# Patient Record
Sex: Male | Born: 1935 | Race: White | Hispanic: No | Marital: Married
Health system: Southern US, Community
[De-identification: ages and names within clinical notes are randomized; demographics above are authoritative.]

---

## 2000-09-18 ENCOUNTER — Inpatient Hospital Stay (HOSPITAL_COMMUNITY): Admission: RE | Admit: 2000-09-18 | Discharge: 2000-09-20 | Payer: Self-pay | Admitting: Gastroenterology

## 2001-08-11 ENCOUNTER — Inpatient Hospital Stay (HOSPITAL_COMMUNITY): Admission: EM | Admit: 2001-08-11 | Discharge: 2001-08-18 | Payer: Self-pay | Admitting: Emergency Medicine

## 2006-09-17 ENCOUNTER — Emergency Department (HOSPITAL_COMMUNITY): Admission: EM | Admit: 2006-09-17 | Discharge: 2006-09-17 | Payer: Self-pay | Admitting: Emergency Medicine

## 2006-09-19 ENCOUNTER — Inpatient Hospital Stay (HOSPITAL_COMMUNITY): Admission: EM | Admit: 2006-09-19 | Discharge: 2006-10-13 | Payer: Self-pay | Admitting: Emergency Medicine

## 2006-09-19 ENCOUNTER — Ambulatory Visit: Payer: Self-pay | Admitting: Cardiology

## 2006-09-25 ENCOUNTER — Encounter: Payer: Self-pay | Admitting: Cardiology

## 2006-10-08 ENCOUNTER — Encounter: Payer: Self-pay | Admitting: Vascular Surgery

## 2007-02-27 ENCOUNTER — Inpatient Hospital Stay (HOSPITAL_COMMUNITY): Admission: EM | Admit: 2007-02-27 | Discharge: 2007-03-09 | Payer: Self-pay | Admitting: *Deleted

## 2007-05-07 ENCOUNTER — Emergency Department (HOSPITAL_COMMUNITY): Admission: EM | Admit: 2007-05-07 | Discharge: 2007-05-07 | Payer: Self-pay | Admitting: Emergency Medicine

## 2008-01-09 IMAGING — CR DG SHOULDER 2+V*L*
2 series · 2 of 2 positions shown · non-contrast
Comparison: 02/27/2007

CLINICAL DATA: Fell with pain. 
 LEFT SHOULDER - 2 VIEW:

[t shoulder ap external left]
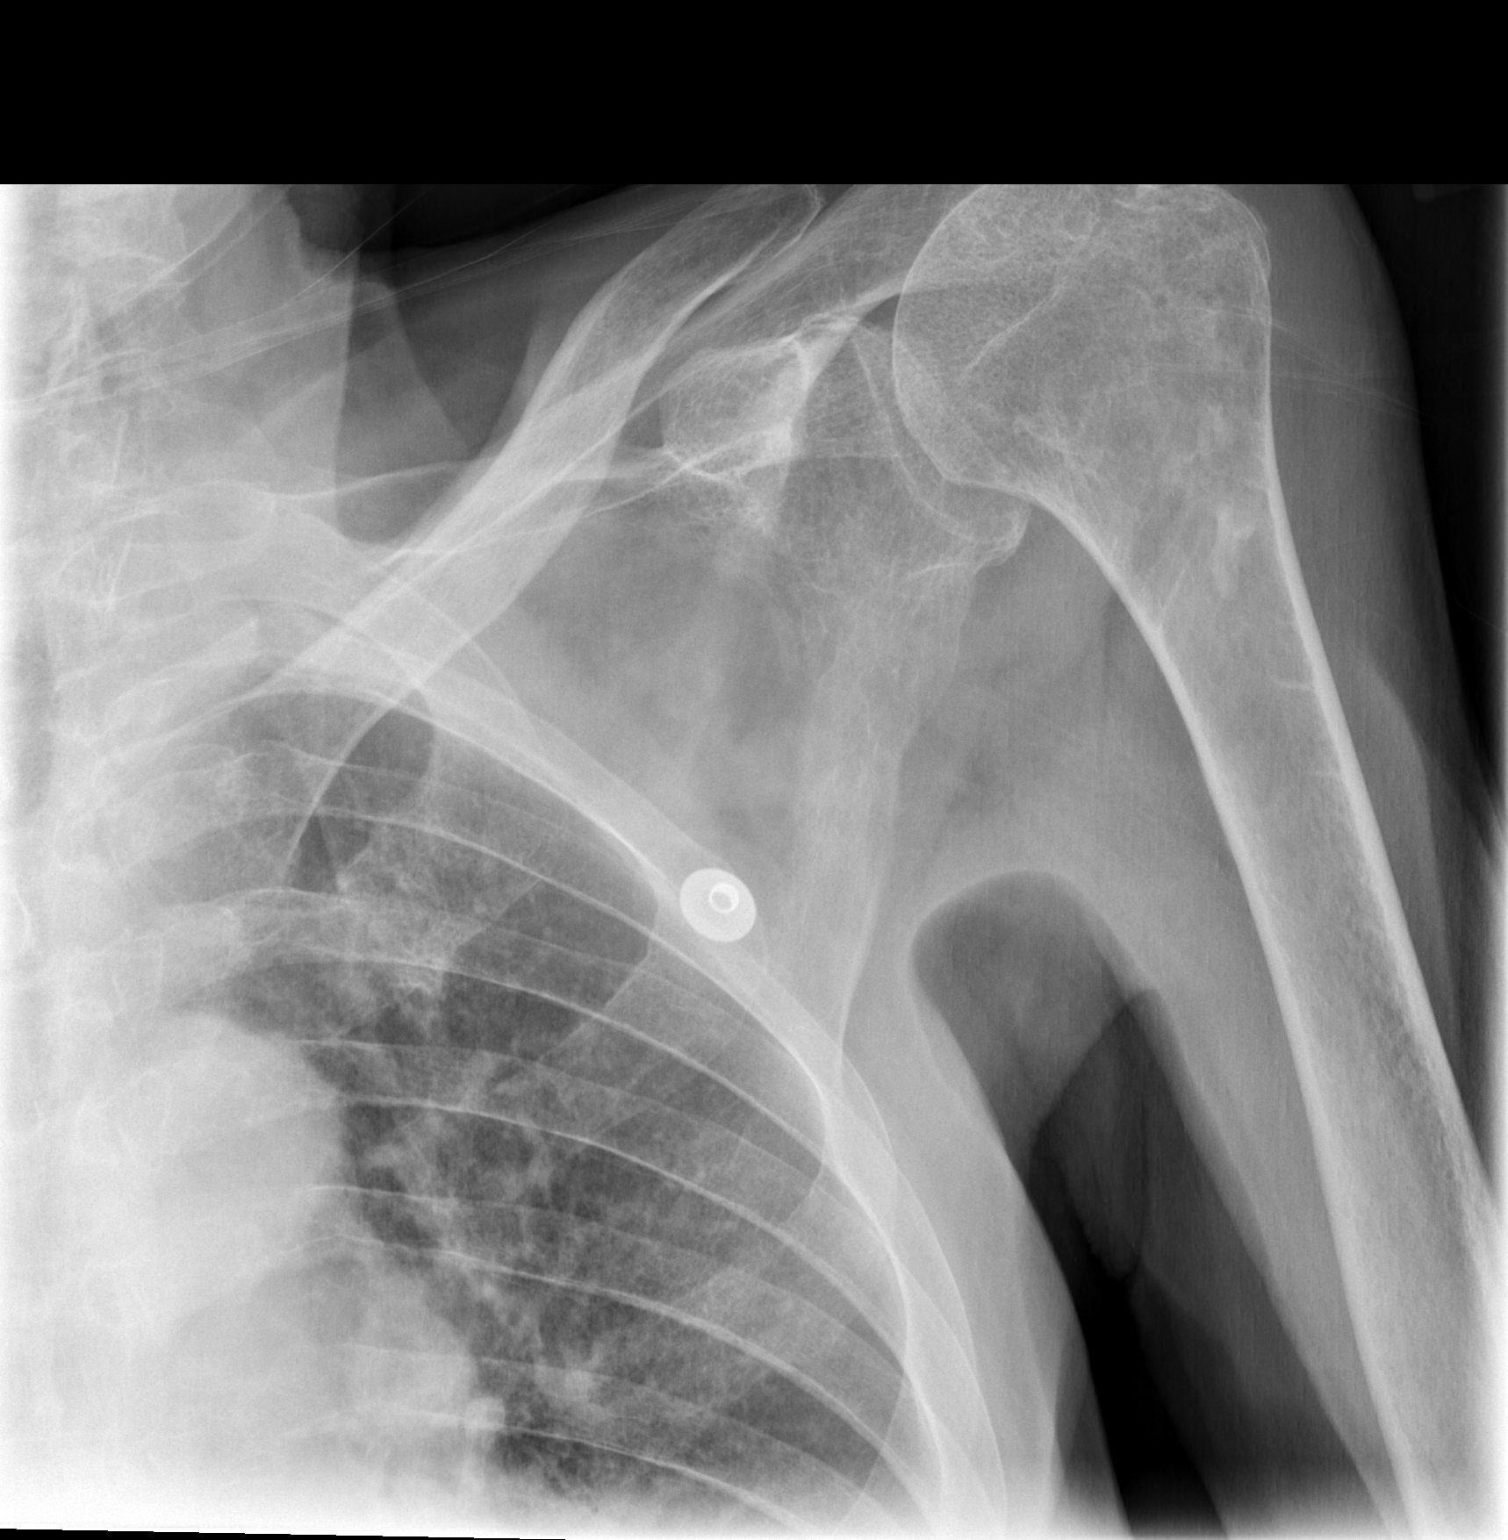

[t shoulder ap internal left]
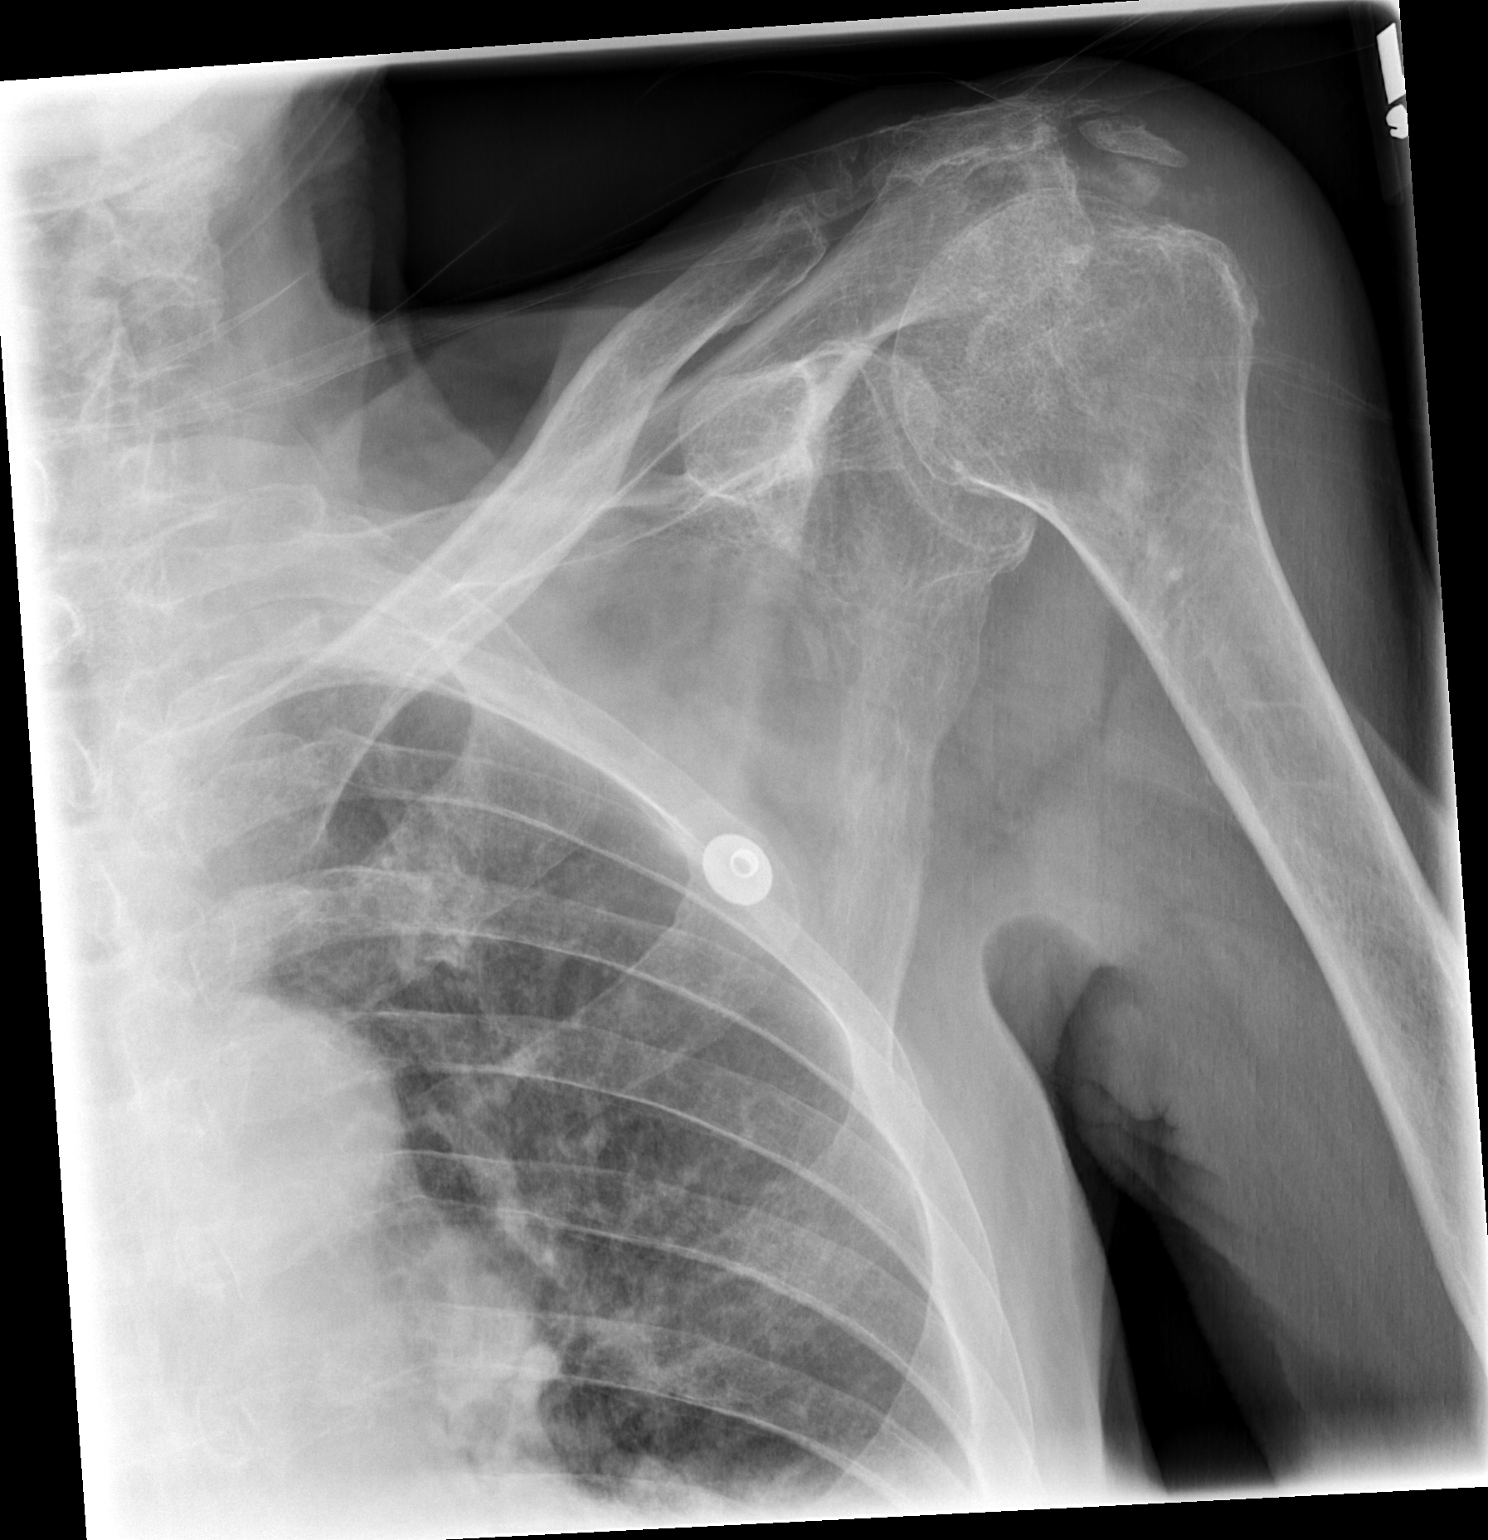

[2 of 2 positions shown; findings below may reference images not displayed]

FINDINGS: Two views of the left shoulder show the bones to be diffusely osteopenic. There are marked degenerative changes in the left shoulder and probable changes of chronic rotator cuff disease and impingement.  However, no acute fracture is seen.
IMPRESSION: Degenerative changes in left shoulder. No acute abnormality. 
 LEFT HUMERUS - 3 VIEW:
FINDINGS: Three views of the left humerus show diffuse osteopenia. No acute abnormality is seen.
IMPRESSION: No fracture.
 CHEST - 1 VIEW:
FINDINGS: Single view of the chest shows no active infiltrate or effusion. The heart is mildly enlarged and stable. The bones are diffusely osteopenic.
IMPRESSION: Mild cardiomegaly. No active lung disease.

## 2008-01-09 IMAGING — CR DG HUMERUS 2V *L*
3 series · 3 of 3 positions shown · non-contrast
Comparison: 02/27/2007

CLINICAL DATA: Fell with pain. 
 LEFT SHOULDER - 2 VIEW:

[t humerus ap left]
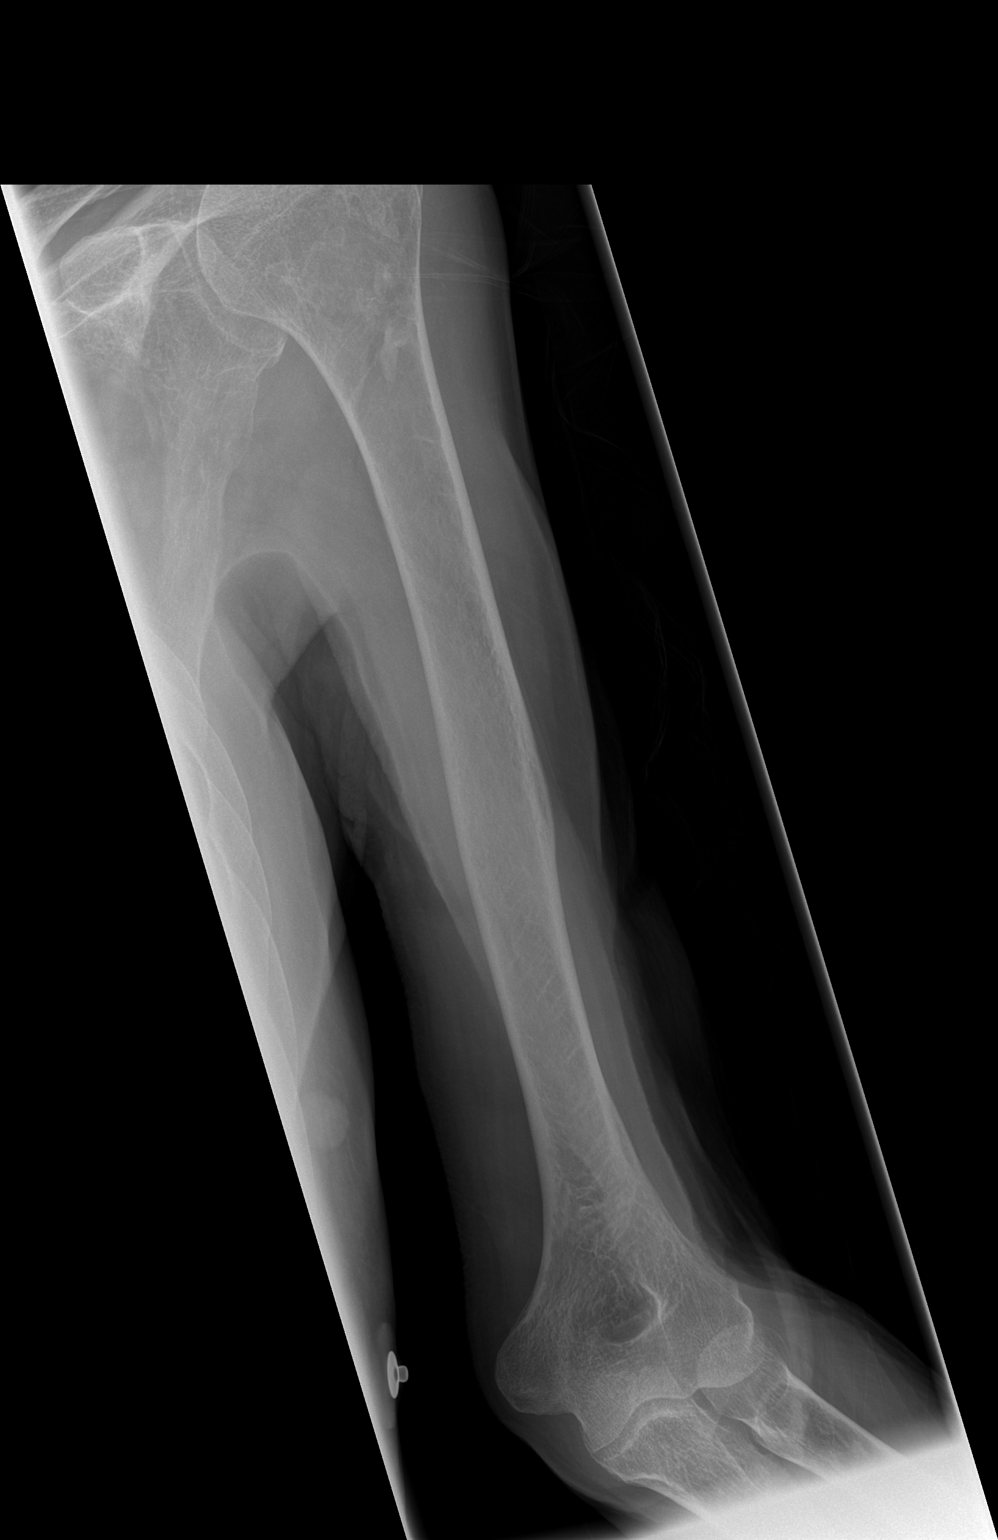

[t humerus lat left]
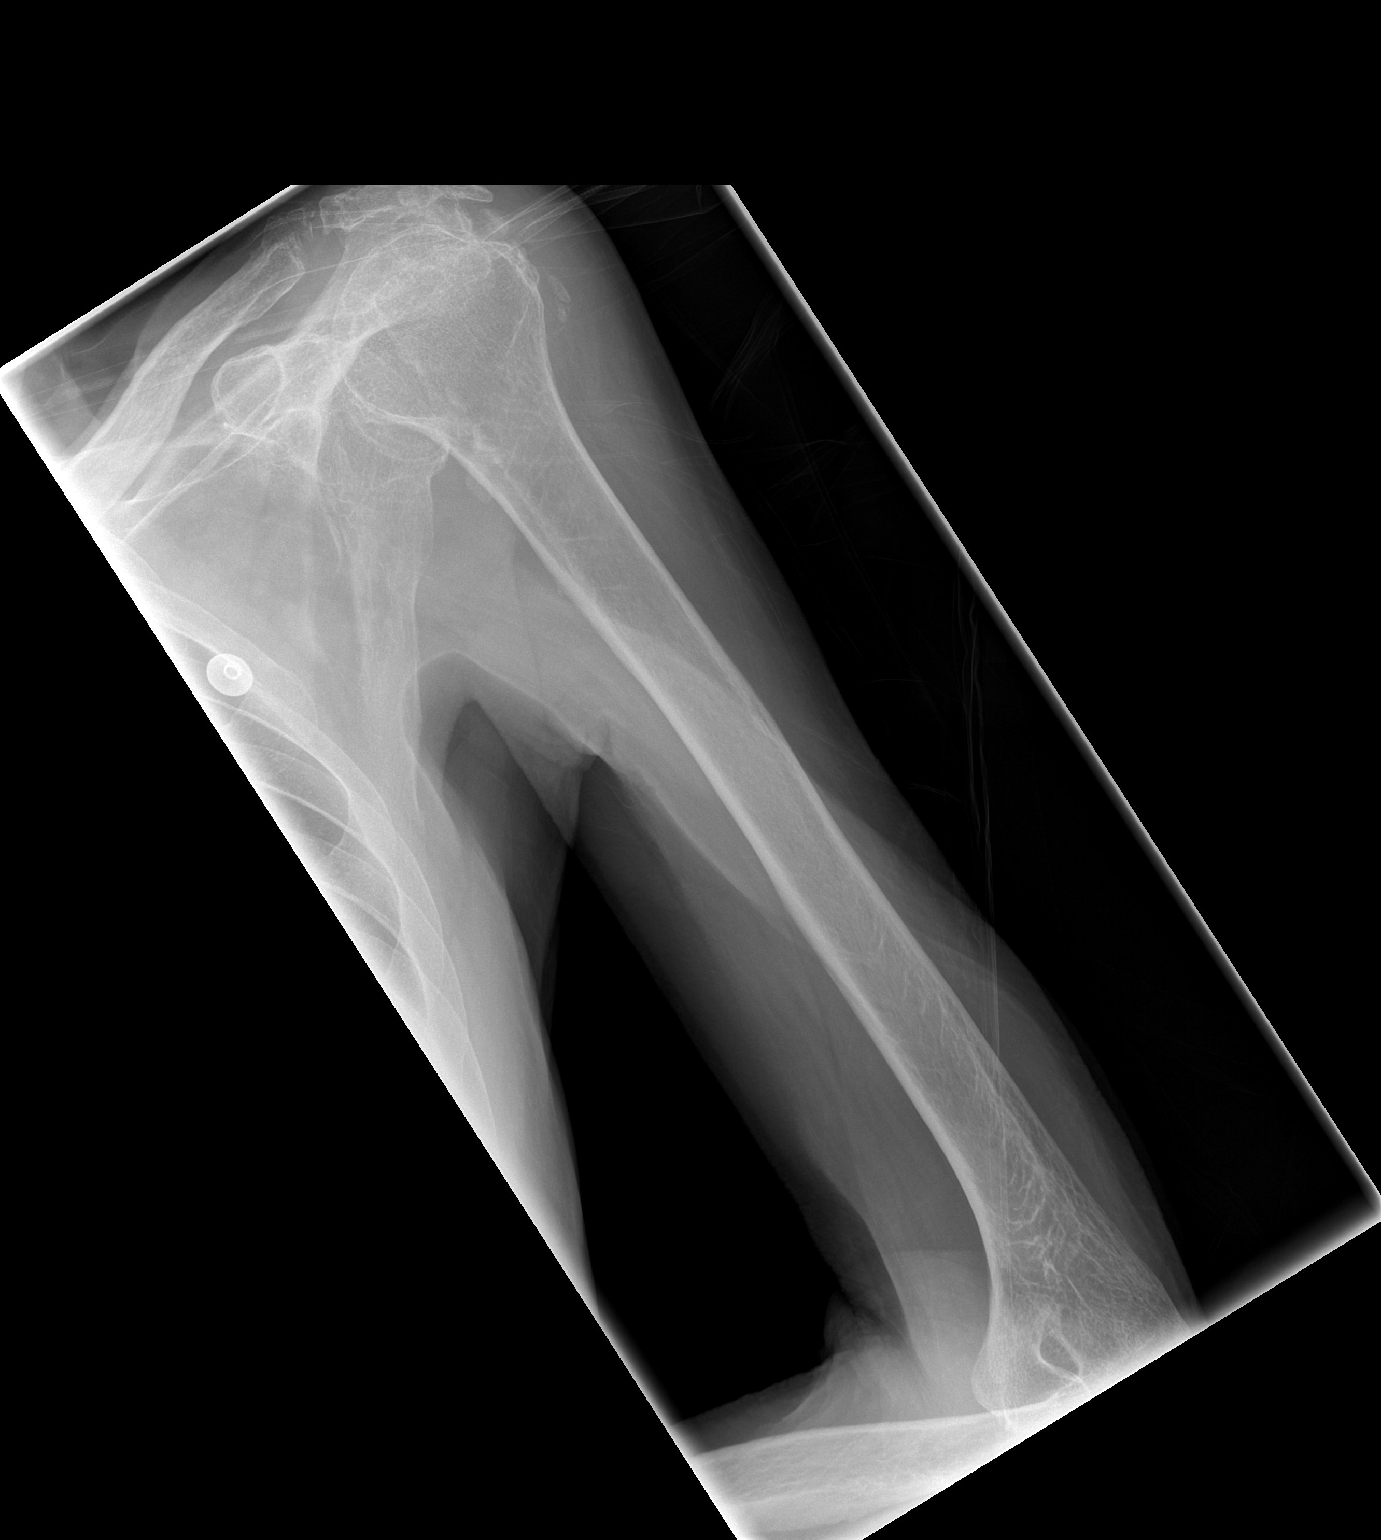

[t shoulder ap internal left]
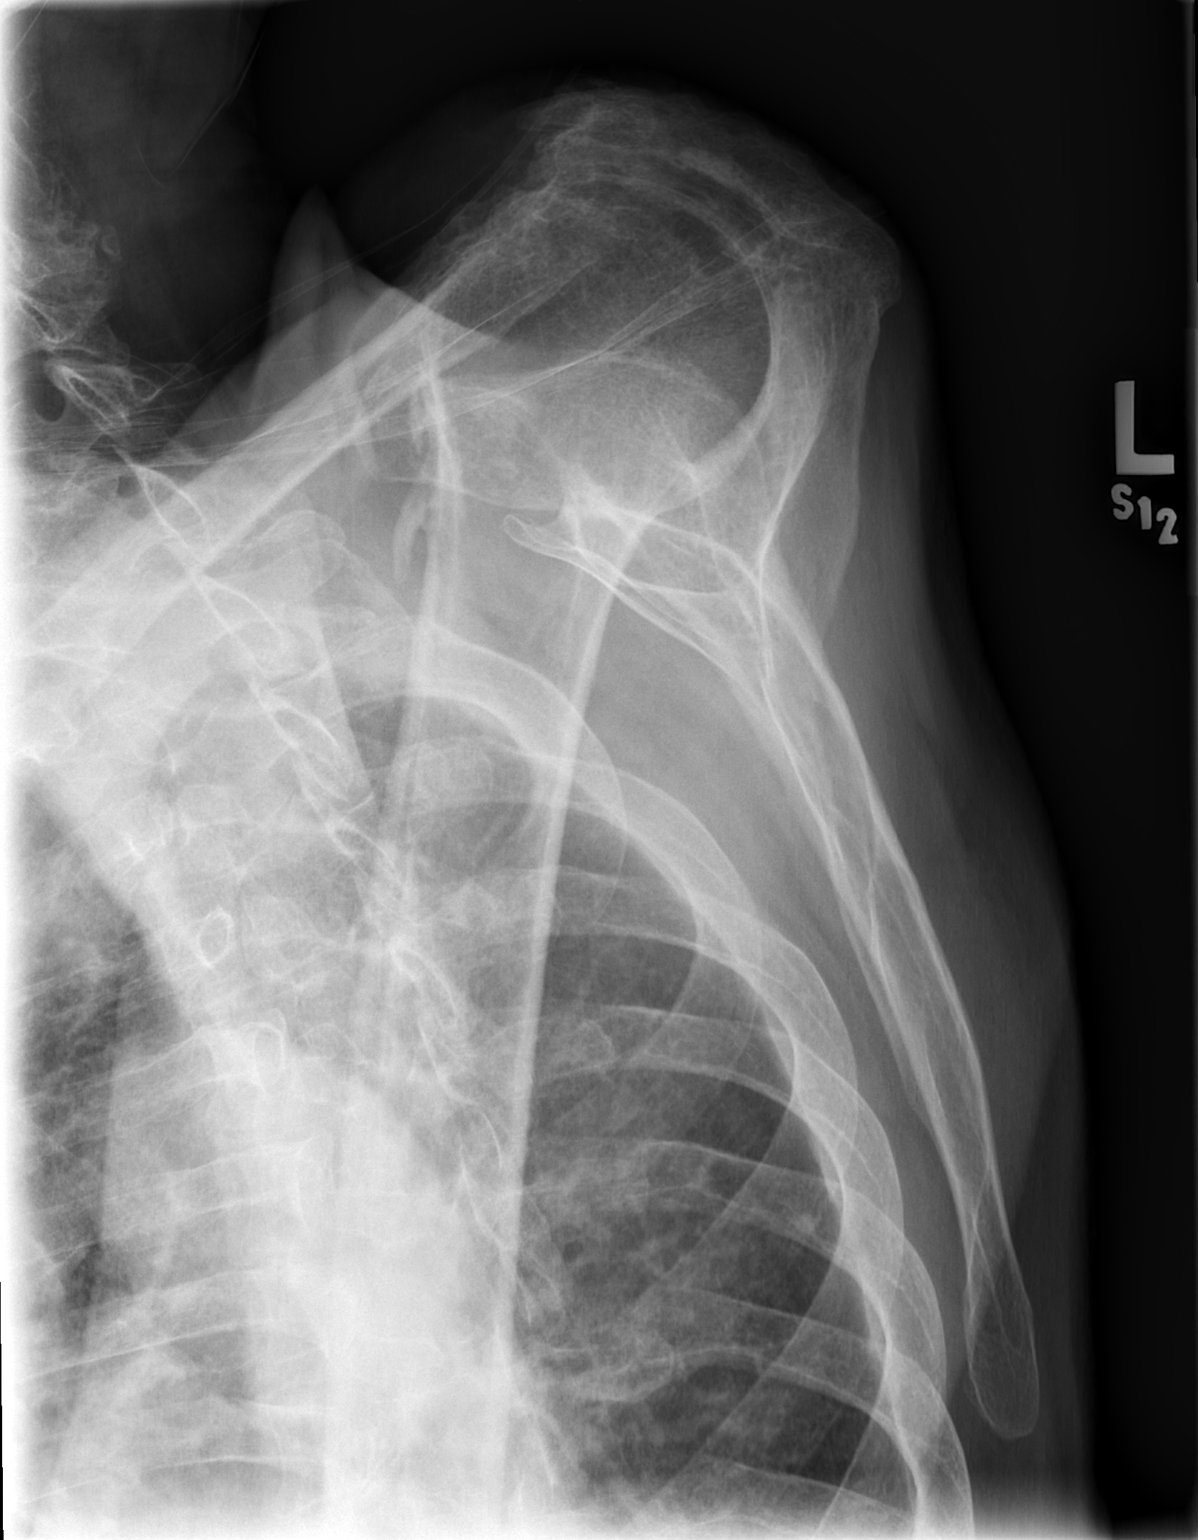

[3 of 3 positions shown; findings below may reference images not displayed]

FINDINGS: Two views of the left shoulder show the bones to be diffusely osteopenic. There are marked degenerative changes in the left shoulder and probable changes of chronic rotator cuff disease and impingement.  However, no acute fracture is seen.
IMPRESSION: Degenerative changes in left shoulder. No acute abnormality. 
 LEFT HUMERUS - 3 VIEW:
FINDINGS: Three views of the left humerus show diffuse osteopenia. No acute abnormality is seen.
IMPRESSION: No fracture.
 CHEST - 1 VIEW:
FINDINGS: Single view of the chest shows no active infiltrate or effusion. The heart is mildly enlarged and stable. The bones are diffusely osteopenic.
IMPRESSION: Mild cardiomegaly. No active lung disease.

## 2010-06-22 DEATH — deceased

## 2011-02-04 NOTE — H&P (Signed)
NAME:  Dominic Jacobs, Dominic Jacobs NO.:  [id number]   MEDICAL RECORD NO.:  [id number]           PATIENT TYPE:   LOCATION:                                 FACILITY:   PHYSICIAN:  Della Goo, M.D. DATE OF BIRTH:  1919/06/27   DATE OF ADMISSION:  02/27/2007  DATE OF DISCHARGE:                              HISTORY & PHYSICAL   PRIMARY CARE PHYSICIAN:  Gloriajean Dell. Andrey Campanile, M.D.   CHIEF COMPLAINT:  Weakness.   HISTORY OF PRESENT ILLNESS:  The history is obtained through the  patient's family and records.  Per the patient's son, the patient has  had decreased p.o. intake over the past 3 days; not eating, not  drinking; also increased weakness and fatigue.  They do not know if  these had fevers, chills, or cough.  There has been no report that he  has had diarrhea, nausea, or vomiting.  The patient is pleasantly  confused.  Denies having any chest pain and shortness of breath at this  time.  Per the patient's family, the patient has also had weakness and  difficulty walking.   PAST MEDICAL HISTORY:  1. A history of atrial fibrillation.  2. Coagulopathy secondary to Coumadin therapy.  3. COPD.  4. Hypertension.  5. Benign prostatic hypertrophy.  6. Blindness left eye.   PAST SURGICAL HISTORY:  Unknown.   MEDICATIONS:  1. Lasix 20 mg one p.o. q.a.m.  2. Potassium 20 mEq one p.o. q.a.m.  3. Lexapro 10 mg one p.o. daily.  4. Lopressor 50 mg one p.o. q.12 h.  5. Atrovent 2 puffs t.i.d.  6. Coumadin 4 mg one p.o. daily.  7. Dulcolax 5 mg one p.o. q.a.m.  8. Prilosec 20 mg one p.o. daily.  9. Oxycodone/APAP 5 mg one p.o. q.6 h. p.r.n.  10.Robitussin AC p.r.n.   ALLERGIES:  To PENICILLIN and SULFA.   SOCIAL HISTORY:  Retired from Counselling psychologist work, widower,  remote history of tobacco and alcohol usage.   FAMILY HISTORY:  Noncontributory.   PHYSICAL EXAMINATION FINDINGS:  GENERAL:  A pleasantly confused 77-year-  old thin male in no acute distress.  VITAL  SIGNS:  Temperature 100.8, 119/64.  HEENT: Normocephalic, atraumatic.  Right pupil reactive to light.  Extraocular muscles are intact.  Left cornea opaque.  Oropharynx  edentulous.  No exudates.  Dry mucosa.  NECK:  Supple full range of motion.  No thyromegaly, adenopathy, or  jugulovenous distension.  CARDIOVASCULAR:  Regular rate and rhythm.  No murmurs, gallops or rubs.  LUNGS:  Clear to auscultation except for occasional rhonchus breath  sounds.  ABDOMEN:  Positive bowel sounds, soft, nontender, nondistended.  EXTREMITIES:  Without cyanosis, clubbing or edema.  Atrophy of the  extremities present.  NEUROLOGIC EXAMINATION:  Mild confusion.  Speech is clear.  Cranial  nerves are intact except the left eye vision and pupillary response.  There are no focal deficits otherwise.   LABORATORY STUDIES:  White blood cell count 21.9, hemoglobin 13.1,  hematocrit 39.2, platelets 313, neutrophils 96% lymphocytes 2%.  Sodium  131, potassium 4.7, chloride 99, CO2 22,  BUN 21, creatinine 1.42,  glucose 123.  Urinalysis negative.  Chest x-ray findings reveal a right  upper lobe and left lower lobe airspace disease process.   ASSESSMENT:  1. An 77 year old male being admitted with bilateral pneumonia.  2. Failure to thrive syndrome.  3. Mild dehydration.  4. Mild hyponatremia.   PLAN:  The patient will be admitted for IV antibiotic treatment with  Rocephin and azithromycin.  He will be placed on IV fluids for  rehydration therapy.  Nebulizer treatments have been ordered with  Atrovent and albuterol.  Supplemental oxygen has been ordered as well.  The patient will continue on his regular medications and his  electrolytes will be adjusted as needed.  The patient will continue on  his Coumadin therapy.  A PT and PTT will be checked daily and GI  prophylaxis has been ordered.      Della Goo, M.D.  Electronically Signed     HJ/MEDQ  D:  02/27/2007  T:  02/28/2007  Job:  [id number]    cc:   Gloriajean Dell. Andrey Campanile, M.D.  Fax: [phone]

## 2011-02-04 NOTE — Discharge Summary (Signed)
NAME:  Dominic Jacobs, Dominic Jacobs                ACCOUNT NO.:  [id number]   MEDICAL RECORD NO.:  [id number]          PATIENT TYPE:  INP   LOCATION:  1435                         FACILITY[facility]  [facility]   PHYSICIAN:  Thomasenia Bottoms, MDDATE OF BIRTH:  [birth year]   DATE OF ADMISSION:  02/27/2007  DATE OF DISCHARGE:                               DISCHARGE SUMMARY   ADDENDUM:  The patient has a folate deficiency and mild B12 deficiency  and also will be going home on folate 1 mg p.o. b.i.d. and vitamin B12  shots 1,000 units IM once a month.  His Coumadin dose will be changed to  2 mg daily, alternating with 1 mg on opposite days.  The patient will be  discharged to the nursing home as soon as his bed is available.      Thomasenia Bottoms, MD  Electronically Signed     CVC/MEDQ  D:  03/09/2007  T:  03/09/2007  Job:  [id number]

## 2011-02-04 NOTE — Discharge Summary (Signed)
NAME:  Dominic Jacobs, Dominic Jacobs                ACCOUNT NO.:  [id number]   MEDICAL RECORD NO.:  [id number]          PATIENT TYPE:  INP   LOCATION:  1435                         FACILITY:  [facility]   PHYSICIAN:  Thomasenia Bottoms, MDDATE OF BIRTH:  1919-01-17   DATE OF ADMISSION:  02/27/2007  DATE OF DISCHARGE:                               DISCHARGE SUMMARY   Please see discharge summary dictated on March 03, 2007.   BRIEF SUMMARY OF HOSPITAL COURSE:  The patient has done well since the  previous discharge summary was dictated.  He essentially has been  waiting for a nursing home bed, and we believe that will be available  very shortly.   MEDICATIONS AT THE TIME OF DISCHARGE:  1. Coumadin 2 mg p.o. daily.  2. Atrovent 2 puffs t.i.d.  3. Lopressor 50 mg p.o. q12h.  4. Lexapro 10 mg p.o. daily.  5. Lasix 20 mEq p.o. daily.  6. Potassium 20 mEq p.o. daily.  7. Guaifenesin 600 mg p.o. b.i.d. p.r.n.  8. Senokot S 1 tablet p.o. every bedtime, hold for diarrhea.  9. Xopenex nebulizer t.i.d. p.r.n.  10.Atrovent nebulizer t.i.d. p.r.n.  11.Protonix 40 mg p.o. daily.  12.Ensure t.i.d.  13.Lopressor 50 mg p.o. q12h.   Please refer to the H&P for admission details and the previous discharge  summary dictated on March 03, 2007.      Thomasenia Bottoms, MD  Electronically Signed     CVC/MEDQ  D:  03/08/2007  T:  03/08/2007  Job:  [id number]   cc:   Gloriajean Dell. Andrey Campanile, M.D.  Fax: [phone]

## 2011-02-04 NOTE — Discharge Summary (Signed)
NAME:  Dominic Jacobs, Dominic Jacobs                ACCOUNT NO.:  [id number]   MEDICAL RECORD NO.:  [id number]          PATIENT TYPE:  INP   LOCATION:  1435                         FACILITY:  [facility]   PHYSICIAN:  Mobolaji B. Bakare, M.D.DATE OF BIRTH:  [birth year]   DATE OF ADMISSION:  02/27/2007  DATE OF DISCHARGE:                               DISCHARGE SUMMARY   PRIMARY CARE PHYSICIAN:  Dr. Benedetto Goad   CARDIOLOGIST:  Dr. Peter Swaziland   FINAL DIAGNOSES:  1. Bilateral lower lobe pneumonia.  2. Severe pharyngeal dysphagia.  3. Moderate oral dysphagia.  4. Coagulopathy secondary to Coumadin with INR is 8.9 without any      bleeding.  5. Failure to thrive.   SECONDARY DIAGNOSES:  1. History of atrial fibrillation.  2. History of multifocal atrial tachycardia.  3. Chronic obstructive pulmonary disease.   PROCEDURE:  1. Chest x-ray done on June 7 showed bilateral airspace disease which      represents bilateral infectious infiltrates, less likely edema.  2. Modified barium swallow which showed severe pharyngeal and moderate      oral dysphagia with silent aspiration of thin liquids.   BRIEF HISTORY:  Mr. Esau is a pleasant 77 year old Caucasian male who  resides at home independently.  His son lives next door and his daughter  about 200 yards away.  He presented to the emergency room with weakness,  decreased appetite, cough.  There were no other symptoms.  He was  pleasantly confused.  Family denies history of dementia.  There was no  chest pain.  Initial vital signs revealed temperature of 100.8.  Lungs  revealed bilateral rhonchi.  Initial laboratory data showed white cell  of 22,000 with a left shift.  Chest x-ray is as noted above.  He was  admitted for bilateral pneumonia, failure to thrive.  He was mildly  dehydrated also.   HOSPITAL COURSE:  #1 - BILATERAL PNEUMONIA.  The patient was started on  IV antibiotics with Rocephin and Zithromax.  He was evidently coughing.  Concern was  possible aspiration.  He was seen by speech therapist and  swallowing evaluation was done.  The patient clearly had severe  pharyngeal and moderate oral dysphagia with silent aspiration of thin  liquids just below the vocal folds.  The patient is felt to be at risk  with any p.o.  However, he appeared to tolerate thinner consistency  better - he had reduced residuals.  Pureed diet D-3 with thin was  recommended; alternatively, n.p.o. and pursuing other means of feeding.  Currently the patient has been tried on D-3/thin liquid.  If he does not  tolerate these, we will further discuss with family other options  available, i.e. PEG tube and n.p.o.  He will complete a 1-week course of  antibiotics.  IV antibiotics have been transitioned to p.o. Avelox.   The patient had mildly elevated troponin of 0.10 maximum.  He had no  acute EKG changes.  EKG showed atrial fibrillation with a heart rate of  95.  CK-MB and relative index were normal.  The patient had no chest  pain.  He had a 2-D echocardiogram in January 2008 which showed an  estimated ejection fraction to be 60%.  We did not pursue any further  workup, given the patient's age and comorbidities.  There was no chest  pain or elevated CK-MB.  Isolated elevated troponin was in the setting  of ongoing respiratory infection.   #2 - COAGULOPATHY SECONDARY TO COUMADIN.  The patient was admitted with  elevated INR above 8.  He had no bleeding.  This was monitored for 3  days without significant reduction.  He was given 2.5 mg of vitamin K  orally.  INR at time of dictation was 3.2.  Coumadin will be recommenced  when appropriate prior to discharge.   #3 - FAILURE TO THRIVE.  The patient's poor p.o. intake is secondary to  the ongoing infection.  His appetite is picking up at the time of  dictation.  He, however, seemed unsafe to go back home without 24-hour  assistance.  The physical therapist has recommended skilled nursing  facility  placement.  This is being pursued at this time.   #4 - HYPERTENSION.  The patient was continued on his home dose  medications.  Blood pressure was relatively stable during the course of  hospitalization.   #5 - CHRONIC OBSTRUCTIVE PULMONARY DISEASE.  He was on nebulizations and  this was relatively stable.   #6 - DIET.  Heart-healthy diet, dysphagia 3/chopped with thin liquid,  aspiration precautions, with the patient being fed when awake, tuck  chin. Follow solid with liquid.  Given p.o. medications in puree, allow  to cough with sips of thin liquids.  Reflux precautions.  Give oral care  t.i.d.   Addendum to this dictation will be made at the time of discharge with  discharge medications and laboratory data.      Mobolaji B. Corky Downs, M.D.  Electronically Signed     MBB/MEDQ  D:  03/03/2007  T:  03/03/2007  Job:  [id number]   cc:   Gloriajean Dell. Andrey Campanile, M.D.  Fax: [phone]   Peter M. Swaziland, M.D.  Fax: [phone]

## 2011-02-07 NOTE — Discharge Summary (Signed)
NAME:  Dominic Jacobs, Dominic Jacobs                ACCOUNT NO.:  [id number]   MEDICAL RECORD NO.:  [id number]          PATIENT TYPE:  INP   LOCATION:  1443                         [facility]ACILITY:  [facility]   PHYSICIAN:  Lonia Blood, M.D.      DATE OF BIRTH:  1919-06-04   DATE OF ADMISSION:  09/19/2006  DATE OF DISCHARGE:  09/29/2006                               DISCHARGE SUMMARY   PRIMARY CARE PHYSICIAN:  Dr. Benedetto Goad, [facility].   DISCHARGE DIAGNOSES:  1. Altered mental status, most likely secondary to dehydration and      acute renal failure, now resolved.  2. New onset atrial fibrillation with normal ejection fraction.  3. Chronic obstructive pulmonary disease.  4. Acute renal failure, now resolved.  5. Dehydration.  6. Generalized debilitation.   SULFA ALLERGY, AND PENICILLIN ALLERGY.   Transient hypokalemia.   DISCHARGE MEDICATIONS:  1. Coumadin, currently on 2 mg a day.  INR to be followed closely.  2. Lopressor 25 mg q.12 hours b.i.d. diuresis.  3. Avelox 400 mg daily for 3 more days.  4. Cardizem CD 360 mg daily.  5. Prevacid 30 mg daily.  6. Albuterol nebulizer 2.5 mg 3 times a day.  7. Atrovent nebulizers 0.5 mg 3 times a day.  8. Dulcolax 10 mg twice a day.  9. Robitussin DM as needed.  10.Hydrochlorothiazide 25 mg daily.   DISPOSITION:  Patient is being discharged to short-term skilled nursing  facility.  He has improved tremendously in the hospital.  He is,  however, weak and needs extensive physical therapy to get back to his  baseline prior to being discharged home.   PROCEDURES PERFORMED THIS ADMISSION:  Head CT without contrast performed  on December 29 showed small rounded hypodense extra-axial focus in the  left frontal region, which could reflect a small focus of extra-axial  hemorrhage or partially calcified mass.  No other acute intracranial  findings demonstrated.  Chest x-ray on December 29 showed progressive  right basilar atelectasis with stable  cardiomegaly.  X-ray of the eye  showed no metallic foreign bodies in the orbit on January 2.  MRI/MRA of  the brain on September 23, 2006 showed a 5-mm left frontal extra-axial  structure which may represent a small meningioma.  No acute infarct or  abnormal intracranial enhancing lesion.  Atrophy with significant small-  vessel disease type changes.  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  Mild mucosal  thickening ethmoid sinus air cells.  An abdominal x-ray on January 3  showed no obstruction or free air.  2D echocardiogram on January 4  showed an EF of 60%, with normal left ventricular function and no  significant findings.  Chest x-ray on January 7 showed new left pleural  effusion which is small, associated with some atelectasis plus or minus  infiltrate in the posterior left lower lobe.   CONSULTATION:  Cardiology, Dr. Swaziland.   BRIEF HISTORY AND PHYSICAL:  Please refer to dictated history and  physical by Dr. Isidor Holts.  In short,  however, this is an 77-year-  old gentleman with multiple medical problems including BPH status post  TURP.  Patient came in secondary to having acute confusion at home.  He  has also had what appears to be acute bronchitis, and was given some  Zithromax by his primary physician.  He took more Zithromax than  prescribed, and he started having nausea and vomiting.  Since that  vomiting, he has become increasingly confused, and was brought in to the  emergency room.  In the ED, patient was found to have an acute renal  failure, be dehydrated, and questionable sepsis.  He was also a little  bit confused, and was subsequently admitted for further management.   HOSPITAL COURSE:  1. Confusion.  This was thought to be secondary to acute renal failure      and severe dehydration.  Patient was hydrated.  His renal function      improved tremendously.  On admission, his creatinine was 2.4, but       has since trended downwards.  With this finding, patient was      hydrated mainly and workup included checking for FENA.  That was      less than 1, indicating that this is prerenal.  His last creatinine      was 1.08.  His mental status has come back to within normal.  2. Atrial fibrillation.  While in the hospital, patient was noted to      have unusual heart rhythm.  EKG performed showed that patient was      in atrial fibrillation.  He had no prior history of atrial      fibrillation.  With this finding, he was transferred to the ICU,      and started on IV Cardizem.  Anticoagulation was, however, delayed      due to patient's head CT showing possible foreign body behind the      left eye.  Subsequently, an MRI was performed that showed no      foreign body, with disks clear.  Patient was initiated on      anticoagulation, and is currently on Coumadin.  His INR has been      therapeutic since then, and his rate has been controlled.  He is on      oral Cardizem at this point.  3. Hypokalemia.  Patient's potassium has dropped to 3 at some point,      and also 2.9 at another time.  This has been resolved so far.  4. COPD.  Patient has had some ongoing cough and possible pneumonia      based on his x-ray.  He has been on antibiotics.  He has been on      Avelox.  He has remained afebrile at this point.   Other medical problems as listed above have been stable, and patient is  debilitated, hence request skilled nursing facility placement prior to  returning home.      Lonia Blood, M.D.  Electronically Signed     LG/MEDQ  D:  09/29/2006  T:  09/29/2006  Job:  [id number]
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
## 2011-02-07 NOTE — Discharge Summary (Signed)
NAME:  Dominic Jacobs, Dominic Jacobs NO.:  [id number]   MEDICAL RECORD NO.:  [id number]          PATIENT TYPE:  INP   LOCATION:  1443                         FACILITY:  [facility]   PHYSICIAN:  Michaelyn Barter, M.D. DATE OF BIRTH:  01-06-1919   DATE OF ADMISSION:  09/19/2006  DATE OF DISCHARGE:  10/13/2006                               DISCHARGE SUMMARY   This is a final discharge summary.  Please refer to the discharge  summaries that were completed by Dr. Marcellus Scott on October 06, 2006  and also by Dr. Lonia Blood on September 29, 2006.  This discharge summary  will only outline those events that have taken place from the timeframe  of October 07, 2006 through October 13, 2006.   FINAL DIAGNOSES:  1. Small bowel obstruction.  On October 06, 2006, the patient      underwent an open repair of bilateral inguinal hernias with mesh.      The procedure was completed by Dr. Glenna Fellows.  The patient      appears to have tolerated the procedure well.  There have been no      obvious complications over the past 7 days.  2. Arrhythmia.  Cardiology has been following the patient over the      past several days.  Their last note was documented October 12, 2006, and they indicated that the patient's rhythm has been stable      since his surgery and since the patient has been on Lopressor.  The      patient had previously been on IV Cardizem drip, but that was      subsequently discontinued, and he was switched over to p.o.      medications.  Over the past 1-2 days, there have been no issues      with regard to him having arrhythmias.  3. History of chronic obstructive pulmonary disease.  This has been      stable over the course of his hospitalization.  4. Hypoalbuminemia.  This may be associated with protein calorie      malnutrition.  The patient has been encouraged to consume regularly      prescribed meals.  This will have to continue once the patient is      discharged  from the hospital.  5. Anemia.  This has been relatively stable over the past few days of      the patient's hospitalization.  6. Severe weakness, deconditioning.  Physical therapy and occupational      therapy have both been following the patient over the course of his      hospitalization.  The patient will require more aggressive physical      therapy once he is discharged from the hospital.  7. Hypokalemia.  The patient's potassium level was noted to have      declined down to 3.2 by October 12, 2006.  He has since received      supplementation.  8. Atrial fibrillation.  Again, the patient has been on      anticoagulation secondary  to this.  He is currently taking Coumadin      for this.  There have been no major issues over the past several      days regarding this, and again cardiology has been following the      patient throughout the past several days of his hospitalization.   CONDITION ON THE DATE OF DISCHARGE:  On the date of discharge, the  patient indicated that he has no new complaints.  He shows no obvious  distress, and he actually looks comfortable.   LABORATORIES:  His white blood cell count is 6.8, hemoglobin 9.8,  hematocrit 28.4, platelets 204.  PT is 31.1, INR 2.8.  Sodium 140,  potassium 3.1, chloride 107, CO2 28, BUN 8, creatinine 1.14.   A decision has been made to discharge the patient from the hospital.   He will be discharged from the hospital on:  1. Dulcolax 5 mg p.o. b.i.d.  2. Lasix 20 mg p.o. daily.  3. Atrovent MDI 2 puffs t.i.d.  4. Lopressor 50 mg p.o. q.12 h.  5. Avelox 400 mg p.o. daily.  6. Protonix 40 mg p.o. daily.  7. K-Dur 20 mEq p.o. daily.  8. Coumadin 1 mg p.o. daily.  9. Robitussin 10 mL p.o. q.6 h. p.r.n.      Michaelyn Barter, M.D.  Electronically Signed     OR/MEDQ  D:  10/13/2006  T:  10/13/2006  Job:  [id number]   cc:   Gloriajean Dell. Andrey Campanile, M.D.  Fax: [phone]

## 2011-02-07 NOTE — Discharge Summary (Signed)
NAME:  Dominic Jacobs, Dominic Jacobs NO.:  [id number]   MEDICAL RECORD NO.:  [id number]          PATIENT TYPE:  INP   LOCATION:  1443                         FACILITY:  [facility]   PHYSICIAN:  Marcellus Scott, MD     DATE OF BIRTH:  Dec 16, 1918   DATE OF ADMISSION:  09/19/2006  DATE OF DISCHARGE:                               DISCHARGE SUMMARY   PRIMARY CARE PHYSICIAN:  Dr. Benedetto Goad of [facility].   This is an addendum discharge summary which will only outline the events  of this patient's inpatient care from January 9th to date.  For details  of his inpatient care prior to that, please refer to the history and  physical note done by Dr. Brien Few on Dec 29th and the discharge summary that  was done by Dr. Ellin Saba on January 8th.   DISCHARGE DIAGNOSES:  1. Small bowel obstruction.  2. Arrhythmias.  3. Chronic obstructive pulmonary disease.  4. Hypertension.  5. Leukocytosis.  6. Prerenal azotemia.   DISCHARGE MEDICATIONS:  To be determined at the time of actual  discharge.   PROCEDURES:  Multiple x-rays of the chest and abdomen since January 8th  to follow up on the small bowel obstruction.   CONSULTS:  1. Surgery, Dr. Johna Sheriff.  2. Cardiology, Dr. Peter Swaziland.   HOSPITAL COURSE/CONDITION OF PATIENT:  Patient was scheduled to be  discharged on the 8th Jan, when overnight he was noted to have abdominal  distention with copious amounts of vomiting, following which discharge  was held, an NG tube was passed, and patient had copious amounts of  bilious drainage.  X-rays subsequently revealed small bowel obstruction.  Surgery was consulted, and they thought that the small bowel obstruction  was secondary to his incarcerated inguinal hernia, which was reducible.  Patient was made nil per oral, placed on intravenous fluids, and serial  abdominal x-rays were followed.  With these measures, the patient  gradually improved with decrease in the abdominal distention  and  decrease in the small bowel obstruction.  The NG tube was subsequently  taken out, and patient was started on some feeds, pending bilateral  herniorrhaphy.  During this course, the patient was having multiple  arrhythmias, including multifocal atrial tachycardias, PVCs,  nonsustained ventricular tachycardic events on the monitor, although he  was in sinus rhythm.  The patient was re-evaluated and followed by  cardiology, who proceeded to start him on beta blockers.  The patient  was also started on a heparin drip for his history of paroxysmal atrial  fibrillation.  Patient was cleared from a cardiac standpoint for  bilateral hernioraphy.  Patient was asymptomatic of any dyspnea, chest  pain.  Had some cough.  He was deemed at some risk for the surgery,  considering his advanced age, his overall debilitated state, and his  history of COPD.  The patient had surgery today.  He is to be followed  over the next couple of days. I have had multiple discussions with the  patient's daughter, Santina Evans, as well as the patient's son, to update  them on the patient's  hospital course.  They would like the patient to  be a full code.  Patient's blood pressures have been in the 140s to 150s  range on the antihypertensives.  The patient also has had persistent  leukocytosis, which briefly came down,  seems to have the etiology which  is unclear.  He has been on IV Avelox.  His chest x-ray is suggestive of  bilateral pleural effusions, and a questionable pneumonia; however,  there is no worsening of lung findings clinically.  There is no fever.  There are no symptoms apart from a dry cough. The leucocytosis has to be  followed. Patient also had a prerenal azotemia which resolved promptly  with IV hydration.  Patient also has been anemic, but his hemoglobins  and hematocrits have been stable.      Marcellus Scott, MD  Electronically Signed     AH/MEDQ  D:  10/06/2006  T:  10/07/2006  Job:   [id number]   cc:   Gloriajean Dell. Andrey Campanile, M.D.  Fax: [phone]   Peter M. Swaziland, M.D.  Fax: [phone]   Lorne Skeens. Hoxworth, M.D.  [address]  [place]  [place] [address]

## 2011-02-07 NOTE — Procedures (Signed)
[place]  Patient:    Dominic Jacobs, Dominic Jacobs Visit Number: [id number] MRN: [id number]          Service Type: MED Location: [phone] Attending Physician:  Anastasio Auerbach Proc. Date: 08/15/01 Admit Date:  08/11/2001   CC:         Anastasio Auerbach, M.D.  Roosvelt Harps, M.D.   Procedure Report  PROCEDURE PERFORMED:  Esophagogastroduodenoscopy with biopsy.  ENDOSCOPIST:  Petra Kuba, M.D.  INDICATION:  Patient with recurrent GI bleeding.  CONSENT:  Consent was signed after risks had been discussed with multiple family members multiple times in the past.  MEDICATIONS:  Demerol 30 mg and Versed 3 mg.  DESCRIPTION OF PROCEDURE:  The video endoscope was inserted by direct vision. The esophagus was normal.  In the distal esophagus was a moderate size hiatal hernia.  The scope was passed into the stomach.  No blood was seen or advanced to the antrum.  There was a widely patent pylorus.  There was a linear antral ulcer with edema, but no signs of bleeding or at risk lesions.  The scope passed easily into the bulb where another small bleeding ulcer was seen, but again no heme.  The scope was inserted around the cecum to a normal second portion of the duodenum.  No blood was seen distally.  The scope was withdrawn back to both the bulb and the antrum.  Both areas were washed and watched, but no active bleeding or other lesions were seen.  The scope was retroflexed, which revealed a normal fundus angularis.  The cardiac was pertinent for the moderate size hiatal hernia.  The lesser and greater curve were normal on retroflexion and straight visualization.  We went ahead and reevaluated the antrum, pylorus, and bulb one more time without additional findings.  One biopsy of the antrum for the CLObiopsy as well as Helicobacter was obtained. Air was suctioned.  The scope removed again.  A good look at the esophagus on slow withdrawal was normal.  The scope was  removed.  The patient tolerated the procedure well.  There were no obvious or immediate complications.  ENDOSCOPIC DIAGNOSES: 1. Moderate hiatal hernia. 2. Shallow linear antral ulcer and shallow bulb ulcer without any    at risk lesions or heme. 3. Otherwise normal esophagogastroduodenoscopy to the second    portion of the duodenum without any blood being seen status    post CLObiopsy of the antrum to rule out Helicobacter.  PLAN: 1. Slowly advance diet. 2. Transfuse p.r.n. 3. Consider outpatient colonoscopy just to be sure, although this    rebleeding was compatible with an upper bleed with    increasing in BUN and melena. 4. Reduce pump inhibitors long-term and not use any aspirin or    nonsteroidals secondary to this being a second bleed and would    go ahead and treat Helicobacter pylori if the CLOtest is    positive. Attending Physician:  Anastasio Auerbach DD:  08/15/01 TD:  08/16/01 Job: [id number]
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
## 2011-02-07 NOTE — Op Note (Signed)
NAME:  JERMAIN, CURT                ACCOUNT NO.:  [id number]   MEDICAL RECORD NO.:  [id number]          PATIENT TYPE:  LINP   LOCATION:  1443                         FACILITY:  [facility]   PHYSICIAN:  Sharlet Salina T. Hoxworth, M.D.DATE OF BIRTH:  [birth year]   DATE OF PROCEDURE:  DATE OF DISCHARGE:                               OPERATIVE REPORT   PRE-AND-POSTOPERATIVE DIAGNOSIS:  Bilateral inguinal hernias.   SURGICAL PROCEDURE:  Open repair of bilateral inguinal hernias with  mesh.   SURGEON:  Lorne Skeens. Hoxworth, M.D.   ANESTHESIA:  General.   BRIEF HISTORY:  Mr. Hylton is 77 year old with multiple medical problems.  While in the hospital he developed evidence of small bowel obstruction;  and on my examination, several days ago, was found to have an  incarcerated right inguinal hernia which was mainly reduced with  resolution of his bowel obstruction.  Also noted was a tender left  inguinal hernia.  With this history, we have recommended proceeding with  bilateral inguinal hernia repair under general anesthesia.  The nature  of he procedure; indications risks of bleeding, infection,  cardiorespiratory complications were discussed with the family, as well  as, the patient preoperatively.  He was brought to the operating room  for this procedure.   DESCRIPTION OF OPERATION:  The patient was brought to the operating room  and placed in the supine position on the operating table and general  endotracheal anesthesia was induced.  He received preoperative IV  antibiotics.  Both groins were widely sterilely prepped and draped.  The  right side was approached first through an oblique incision in the  groin.  Dissection was carried down through subcutaneous tissues using  cautery.  The external oblique was identified and divided along the line  of its fibers through the external ring.  The cord was dissected off the  floor the pubic tubercle and cremasteric fibers were divided bilaterally  completely freeing the cord up to the internal ring.  There was a good-  sized indirect sac; and a lot of edema and inflammatory change around  the sac; and the cord structures consistent with a recent significant  episode of incarceration.  The sac was dissected free from the cord  structures using cautery and blunt dissection and completely freed up to  the level of the internal ring.  The sac was opened; and there was a  portion of the bowel in the sac, which was reduced; and the sac was  suture-ligated at the internal ring with 2-0 silk and the sac removed.  A piece of Parietex mesh was used, trimmed to size, to fit the floor  with tails going around the cord to the internal ring.  The mesh was  sutured to the pubic tubercle and then to the inguinal ligament working  medial-to-lateral with running 2-0 Prolene.  Medially, the mesh was  sutured to the edge of the rectus sheath with interrupted 2-0 Prolene.  Tails were then tacked together lateral to the cord with interrupted  Prolene creating a new internal ring snugged with a fingertip.   The cord was returned  to its anatomic position.  There appeared to be  good coverage of the direct and indirect spaces.  The external oblique  was closed over this with running of 3-0 Vicryl.  Scarpa fascia was  closed with running 3-0 Vicryl.  The skin was closed with running  subcuticular 4-0 Monocryl and Steri-Strips.  Following this, the left  side was explored in identical fashion.  Findings here were very similar  with a good sized indirect hernia; the  sac was somewhat smaller; and without the associated inflammatory  change.  An identical repair was performed with Parietex mesh, and the  closure was identical.  Sponge, needle, and instrument counts were  correct.  Dry dressings were applied; and the patient taken recovery in  good condition.      Lorne Skeens. Hoxworth, M.D.  Electronically Signed     BTH/MEDQ  D:  10/06/2006  T:   10/07/2006  Job:  [id number]
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
## 2011-02-07 NOTE — Consult Note (Signed)
NAME:  Dominic Jacobs, Dominic Jacobs NO.:  [id number]   MEDICAL RECORD NO.:  [id number]          PATIENT TYPE:  INP   LOCATION:  1443                         FACILITY:  [facility]   PHYSICIAN:  Peter M. Swaziland, M.D.  DATE OF BIRTH:  [birth year]   DATE OF CONSULTATION:  09/25/2006  DATE OF DISCHARGE:                                 CONSULTATION   HISTORY OF PRESENT ILLNESS:  [name] is an 77 year old white male  admitted on September 19, 2006 with dehydration, sepsis, and confusion.  During this hospitalization the patient has demonstrated multiple  arrhythmias, including paroxysmal atrial fibrillation with rapid  ventricular response, frequent PACs multifocal atrial tachycardia, and  PVC couplets and triplets.  He describes a history of irregular pulse  for over 5 years.  He has had no dizziness or syncope.  He denies any  chest pain.  He has had chronic dyspnea due to his COPD.  He has been  placed on Lopressor and Cardizem here in the hospital with excellent  rate control.  Coumadin has been initiated.  He has no history of  angina, myocardial infarction, or congestive heart failure.   PAST MEDICAL HISTORY:  1. COPD.  2. Remote history of GI bleed due to duodenal ulcer in 2002.  3. Hiatal hernia.  4. BPH.  5. Trigeminal neuralgia.  6. History of left retinal attachment.  7. Status post cataract surgery.   ALLERGIES:  He is allergic to PENICILLIN and SULFA.   CURRENT MEDICATIONS INCLUDE:  1. Protonix 40 mg per day.  2. Metoprolol 25 mg b.i.d.  3. Diltiazem 90 mg q.6 h.  4. Avelox 400 mg per day.  5. Coumadin per pharmacy protocol.  6. Atrovent and Xopenex nebulizer therapy.   SOCIAL HISTORY:  Patient is married.  He has a son and daughter.  He  quit smoking 20 years ago.  He denies alcohol use.   FAMILY HISTORY:  Noncontributory.   PHYSICAL EXAM:  GENERAL:  The patient is elderly white male sitting up  in chair in no distress.  VITAL SIGNS:  His blood pressure  139/80, pulse is 80 and irregular.  He  has sinus rhythm with frequent PACs on the monitor.  Temperature is  99.3.  Saturations are 97% on room air.  HEENT EXAM:  He is normocephalic, atraumatic.  Pupils equal round and  reactive.  Sclerae are clear.  Oropharynx is clear.  He is missing a  number of teeth.  NECK:  His jugular venous pulse is normal.  He has had no adenopathy or  bruits.  LUNGS:  Clear to auscultation and percussion.  CARDIAC EXAM:  Reveals irregular rhythm without gallop, murmur, rub, or  click.  ABDOMEN:  Soft, nontender without hepatosplenomegaly or masses.  EXTREMITIES:  Without edema.  Pedal pulses are good.  He has no  cyanosis.  NEUROLOGIC EXAM:  The patient is alert and oriented x3.   LABORATORY DATA:  White count is 10,200, hemoglobin 12.9, hematocrit  37.6, platelets 344,000.  ProTime is 15.5 with an INR of 1.2.  TSH is  0.413.  Sodium is  138, potassium 3.6, chloride 110, CO2 22, BUN 15,  creatinine 1.1, glucose of 95.  Urine drug screen was negative.  Urinalysis was negative.  Cardiac enzymes were negative on admission.  Liver function studies were normal.   ECG:  Dated 12/29 showed MAT with aberrancy.   CHEST X-RAY:  Showed no active disease.   ECHOCARDIOGRAM:  Showed normal left ventricular size with mild  concentric LVH with normal systolic function.  There was mild left  atrial enlargement.  Valvular function appeared normal.   IMPRESSION:  1. Paroxysmal atrial fibrillation with rapid ventricular response,      exacerbated by acute illness with infection and dehydration;      clinically improved with rate control.  2. MAT/premature atrial contractions.  3. Premature ventricular contractions.  4. Hypertension with left ventricular hypertrophy.  5. Chronic obstructive pulmonary disease.  6. Dehydration, resolved.  7. Sepsis.   PLAN:  I would change to long-acting diltiazem formulation.  Will  continue on his beta blocker therapy.  I have with  Coumadin to reduce  his risk of stroke with target INR of 2.  At this point, he requires no  further cardiac workup; and is on appropriate therapy.           ______________________________  Peter M. [place]il[place], M.D.     PMJ/MEDQ  D:  09/25/2006  T:  09/25/2006  Job:  [id number]   cc:   Gloriajean Dell. Andrey Campanile, M.D.  Fax: [phone]   [employer] Hospitalist Team
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
## 2011-07-04 LAB — COMPREHENSIVE METABOLIC PANEL WITH GFR
ALT: 15
AST: 24
Albumin: 2.4 — ABNORMAL LOW
Alkaline Phosphatase: 100
BUN: 21
CO2: 26
Calcium: 8.9
Chloride: 104
Creatinine, Ser: 1.22
GFR calc non Af Amer: 56 — ABNORMAL LOW
Glucose, Bld: 149 — ABNORMAL HIGH
Potassium: 4.1
Sodium: 137
Total Bilirubin: 0.6
Total Protein: 7.2

## 2011-07-04 LAB — URINALYSIS, ROUTINE W REFLEX MICROSCOPIC
Glucose, UA: NEGATIVE
Ketones, ur: NEGATIVE
Specific Gravity, Urine: 1.022
pH: 5.5

## 2011-07-04 LAB — DIFFERENTIAL
Basophils Absolute: 0.1
Basophils Relative: 1
Eosinophils Absolute: 0
Lymphocytes Relative: 5 — ABNORMAL LOW
Monocytes Relative: 10
Neutro Abs: 10.2 — ABNORMAL HIGH

## 2011-07-04 LAB — PROTIME-INR
INR: 1.6 — ABNORMAL HIGH
Prothrombin Time: 19.1 — ABNORMAL HIGH

## 2011-07-04 LAB — POCT CARDIAC MARKERS
CKMB, poc: <1 — ABNORMAL LOW
Myoglobin, poc: 66.5
Operator id: 161631
Troponin i, poc: <0.05

## 2011-07-04 LAB — CBC
MCHC: 33.8
RDW: 13.6

## 2011-07-09 LAB — PROTIME-INR
INR: 2.6 — ABNORMAL HIGH
INR: 2.7 — ABNORMAL HIGH
INR: 3.1 — ABNORMAL HIGH
Prothrombin Time: 29.8 — ABNORMAL HIGH

## 2011-07-10 LAB — CBC
HCT: 31.3 — ABNORMAL LOW
HCT: 32 — ABNORMAL LOW
HCT: 33.9 — ABNORMAL LOW
HCT: 34.1 — ABNORMAL LOW
Hemoglobin: 11.6 — ABNORMAL LOW
Hemoglobin: 13.1
MCHC: 33.7
MCHC: 33.9
MCHC: 33.9
MCV: 95.8
MCV: 96
MCV: 96.1
MCV: 96.2
Platelets: 272
Platelets: 275
Platelets: 293
RBC: 3.55 — ABNORMAL LOW
RBC: 4.05 — ABNORMAL LOW
RDW: 15.4 — ABNORMAL HIGH
WBC: 20.4 — ABNORMAL HIGH
WBC: 9.4

## 2011-07-10 LAB — BASIC METABOLIC PANEL
BUN: 16
BUN: 18
CO2: 22
CO2: 22
CO2: 25
Calcium: 8.8
Chloride: 102
Chloride: 105
Chloride: 106
Creatinine, Ser: 0.96
GFR calc Af Amer: 57 — ABNORMAL LOW
GFR calc Af Amer: >60
GFR calc non Af Amer: 47 — ABNORMAL LOW
Glucose, Bld: 106 — ABNORMAL HIGH
Glucose, Bld: 84
Potassium: 3.7
Potassium: 3.7
Potassium: 4.1
Potassium: 4.7
Sodium: 131 — ABNORMAL LOW
Sodium: 134 — ABNORMAL LOW

## 2011-07-10 LAB — URINALYSIS, ROUTINE W REFLEX MICROSCOPIC
Bilirubin Urine: NEGATIVE
Glucose, UA: NEGATIVE
Ketones, ur: NEGATIVE
Nitrite: NEGATIVE
Protein, ur: NEGATIVE
Urobilinogen, UA: 1

## 2011-07-10 LAB — CULTURE, BLOOD (ROUTINE X 2): Culture: NO GROWTH

## 2011-07-10 LAB — CARDIAC PANEL(CRET KIN+CKTOT+MB+TROPI)
CK, MB: 1.5
CK, MB: 1.7
Relative Index: INVALID
Total CK: 10
Total CK: 11
Troponin I: 0.1 — ABNORMAL HIGH

## 2011-07-10 LAB — PROTIME-INR
INR: 1.8 — ABNORMAL HIGH
INR: 8.7
Prothrombin Time: 21.5 — ABNORMAL HIGH
Prothrombin Time: 23.2 — ABNORMAL HIGH
Prothrombin Time: 79.9 — ABNORMAL HIGH

## 2011-07-10 LAB — B-NATRIURETIC PEPTIDE (CONVERTED LAB): Pro B Natriuretic peptide (BNP): 305 — ABNORMAL HIGH

## 2011-07-10 LAB — DIFFERENTIAL
Basophils Absolute: 0
Basophils Relative: 0
Eosinophils Absolute: 0
Eosinophils Absolute: 0.1
Eosinophils Relative: 0
Eosinophils Relative: 0
Eosinophils Relative: 1
Lymphocytes Relative: 2 — ABNORMAL LOW
Lymphs Abs: 1.1
Monocytes Absolute: 0.5
Neutrophils Relative %: 96 — ABNORMAL HIGH

## 2011-07-10 LAB — TSH: TSH: 0.799

## 2011-07-10 LAB — FOLATE: Folate: 2.8

## 2011-07-10 LAB — VITAMIN B12: Vitamin B-12: 272 (ref 211–911)

## 2018-02-04 ENCOUNTER — Other Ambulatory Visit: Payer: Self-pay

## 2018-02-04 NOTE — Patient Outreach (Signed)
[facility]) Care Management  02/04/2018  Dominic Jacobs [birth year] [id number]  Transition of care  Referral date: 01/29/18 Referral source: discharged from an inpatient admission from [facility] on     01/28/18. Insurance: HTA Attempt #1  Telephone call to patient regarding transition of care referral. Unable to reach. HIPAA compliant voice message left with call back phone number.    PLAN: RNCm will attempt 2nd telephone call within 4 business days.  RNCM will send outreach letter.   George Ina RN,BSN,CCM [facility] Telephonic  [phone]

## 2018-02-09 ENCOUNTER — Other Ambulatory Visit: Payer: Self-pay

## 2018-02-09 NOTE — Patient Outreach (Signed)
[facility]) Care Management  02/09/2018  Dominic Jacobs [birth year] [id number]  Transition of care  Referral date: 01/29/18 Referral source: discharged from an inpatient admission from [facility] on     01/28/18. Insurance: HTA Attempt #2  Telephone call to patient regarding transition of care referral. Contact answering phone states patient is not there.  HIPAA compliant message left with call back phone number.   PLAN:  RNCM will attempt 3rd telephone call to patient within 4 business days.   George Ina RN,BSN,CCM [facility] Telephonic  [phone]

## 2018-02-11 ENCOUNTER — Ambulatory Visit: Payer: Self-pay

## 2018-02-12 ENCOUNTER — Other Ambulatory Visit: Payer: Self-pay

## 2018-02-12 NOTE — Patient Outreach (Signed)
[facility]) Care Management  02/12/2018  Dominic Jacobs [birth year] [id number]   Transition of care  Referral date: 01/29/18 Referral source: discharged from an inpatient admission from [facility] on     01/28/18. Insurance: HTA Attempt #3  Third telephone call to patient regarding transition of care referral. Unable to reach. HIPAA compliant voice message left with call back phone number.    PLAN: If no return call from patient will proceed with case closure.   George Ina RN,BSN,CCM [facility] Telephonic  [phone]

## 2018-02-18 ENCOUNTER — Other Ambulatory Visit: Payer: Self-pay

## 2018-02-18 NOTE — Patient Outreach (Addendum)
[facility]) Care Management  02/18/2018  ANIRUDH BAIZ Mar 08, 1919 [id number]  Case closure:  No response from patient after 3 telephone calls and outreach letter attempt.  PLAN;  RNCM will close patient due to being unable to contact RNCM will send closure notification to patient's primary care provider  Addendum:  No primary care provider listed.  Unable to send close notification.  George Ina RN,BSN,CCM [facility] Telephonic  [phone]
